# Patient Record
Sex: Female | Born: 1995 | Race: White | Hispanic: No | Marital: Single | Smoking: Never smoker
Health system: Southern US, Community
[De-identification: ages and names within clinical notes are randomized; demographics above are authoritative.]

## PROBLEM LIST (undated history)

## (undated) DIAGNOSIS — R55 Syncope and collapse: Secondary | ICD-10-CM

## (undated) DIAGNOSIS — I498 Other specified cardiac arrhythmias: Secondary | ICD-10-CM

## (undated) DIAGNOSIS — G90A Postural orthostatic tachycardia syndrome (POTS): Secondary | ICD-10-CM

## (undated) DIAGNOSIS — R000 Tachycardia, unspecified: Secondary | ICD-10-CM

## (undated) DIAGNOSIS — J45909 Unspecified asthma, uncomplicated: Secondary | ICD-10-CM

## (undated) DIAGNOSIS — I951 Orthostatic hypotension: Secondary | ICD-10-CM

## (undated) HISTORY — PX: CARDIAC CATHETERIZATION: SHX172

## (undated) HISTORY — PX: ABLATION: SHX5711

---
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
## 2016-06-06 IMAGING — CR DG CHEST 2V
2 series · 2 of 2 positions shown · non-contrast
Comparison: 01/01/2014

CLINICAL DATA: Syncope after nausea and vomiting. Cough and
congestion for 6 days. Diagnosis yesterday with pneumonia.

EXAM:
CHEST  2 VIEW

[chest pa]
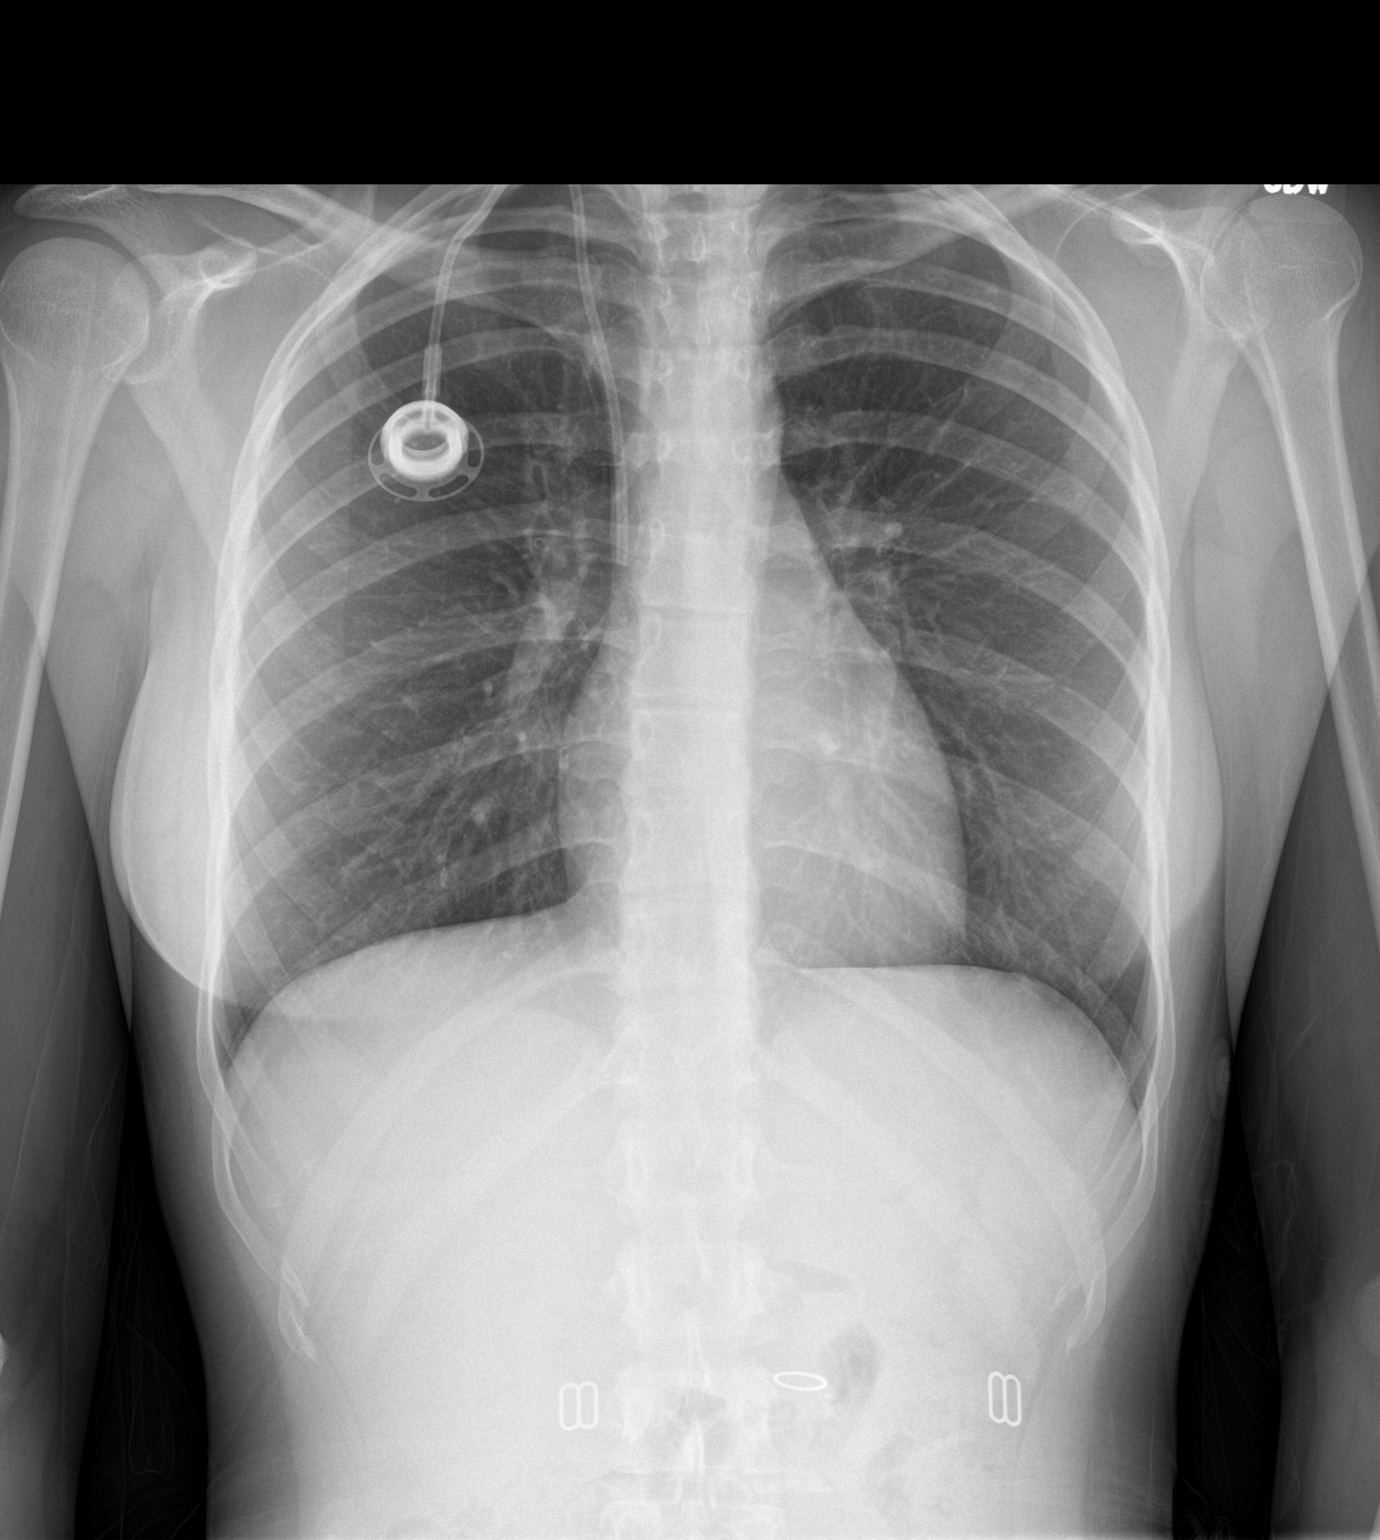

[chest lat]
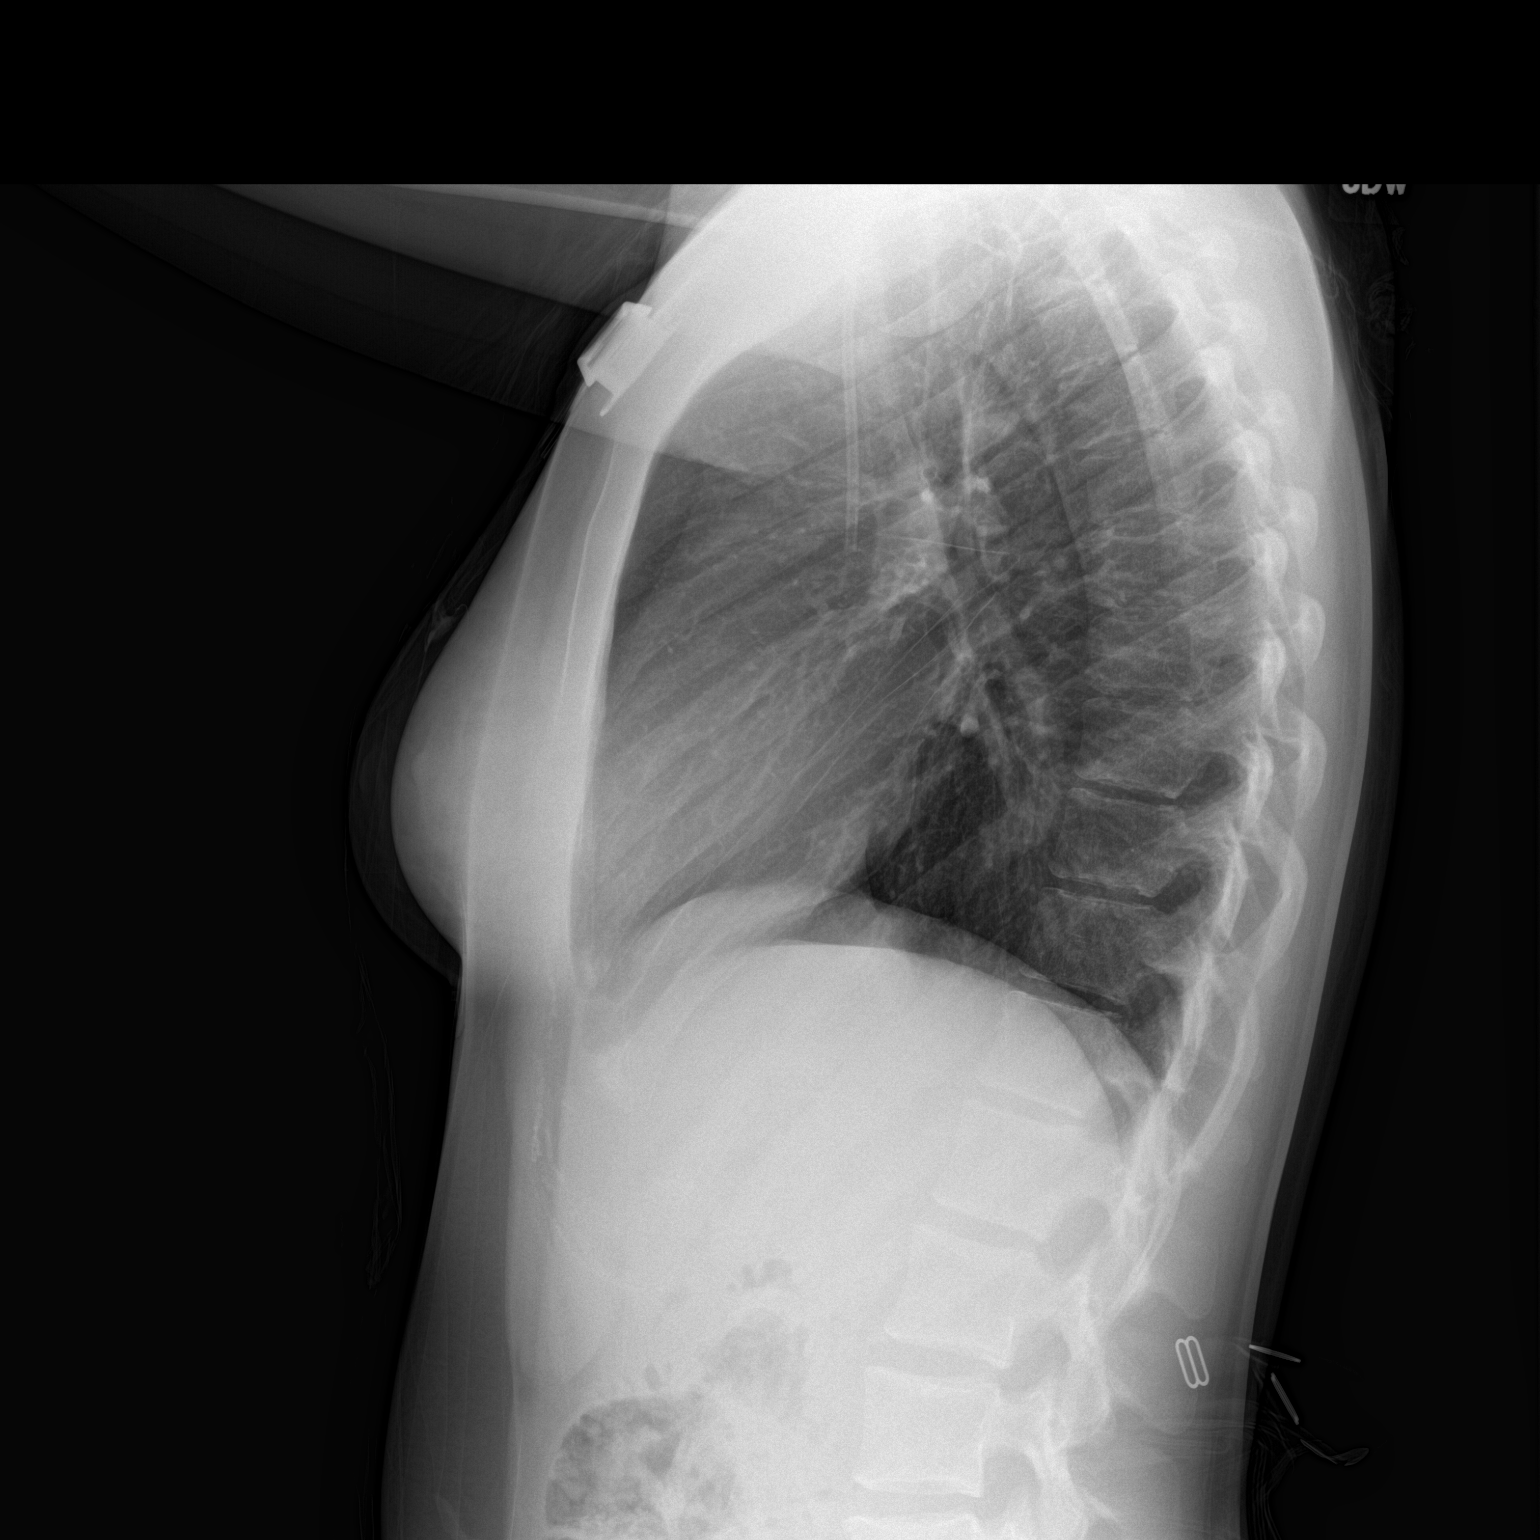

[2 of 2 positions shown; findings below may reference images not displayed]

FINDINGS: Right-sided Infuse-A-Port catheter is unchanged in position. The
heart size and mediastinal contours are within normal limits. Both
lungs are clear. The visualized skeletal structures are
unremarkable.
IMPRESSION: No active cardiopulmonary disease.
